# Patient Record
Sex: Male | Born: 1980 | Race: White | Hispanic: No
Health system: Western US, Academic
[De-identification: ages and names within clinical notes are randomized; demographics above are authoritative.]

## PROBLEM LIST (undated history)

## (undated) DIAGNOSIS — M1990 Unspecified osteoarthritis, unspecified site: Secondary | ICD-10-CM

## (undated) DIAGNOSIS — M259 Joint disorder, unspecified: Secondary | ICD-10-CM

## (undated) HISTORY — PX: PR APPENDECTOMY: 44950

## (undated) HISTORY — PX: PR UNLISTED PROCEDURE SHOULDER: 23929

## (undated) HISTORY — DX: Joint disorder, unspecified: M25.9

## (undated) HISTORY — PX: PR TONSILLECTOMY ONE-HALF <AGE 12: 42825

## (undated) HISTORY — DX: Unspecified osteoarthritis, unspecified site: M19.90

---

## 2015-11-08 ENCOUNTER — Telehealth (HOSPITAL_BASED_OUTPATIENT_CLINIC_OR_DEPARTMENT_OTHER): Payer: Self-pay | Admitting: Orthopaedic Surgery

## 2015-11-08 NOTE — Telephone Encounter (Signed)
Email received: Attached are photos I took of my X-rays from 03 Oct 2015.Chad Cordial, Nov 05, 2015 at 7:35 PM, Carolina Sink ?[email or web address]>? wrote:,\Good evening. I am messaging you with regard to my upcoming appointment on the 26th of June. 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 I have also included links that will allow you to download all the files for my X-rays and CT scan.I doubt you will be able to speed up when we are able to meet, but please let me know what you think, and if you have any preliminary ideas about what can be done. Thank you.\fs25Shoulder History: Summer of 2002 - Chainsaw accident (picture attached). Chainsaw kicked back during use and cut into the muscle of the front of the left shoulder. No damage to nerves or the shoulder itself. Cut the major blood vessel running through there (not sure what it's called). Sewn up in the ER. Winter of 2002/03 - Dislocated while snowboarding. Had arthroscopic surgery the following spring in [place] (forgot the doctor's name, may be in my medical record, or can locate later). Spring of 2007 - Dislocated playing intramural sports in college. Had arthroscopic surgery in [place] following graduation in May (forgot the doctor's name, may be in my medical record, or can locate later). 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 Had Latarjet surgery later that summer in [place] (forgot the doctor's name, may be in my medical record, or can locate later). Fall of 2015 - Blunt trauma to my shoulder caused one of the screws in the Latarjet procedure to shear. No dislocation. Had X-rays (picture attached) and CT scan which showed the damage to the screw. At the time the orthopedic doctor I saw said it was  fine, and that sometimes the screws shear on their own (not what my most recent ortho said I should have been told) 03 Oct 2015 - I wake up in the morning and eventually notice my shoulder/tricep are uncomfortable. I go through a range of motions and notice it feels stuck sometimes in positions elevated above my head. Rotations are also extremely painful. Afterward, I notice a significant amount of pressure in the front of my shoulder, with a dull, constant pain (3-4 on a 1-10 scale). Eventually any bicep activation (movement at the elbow) causes shooting pain into my shoulder (7 on a 1-10 scale). I go to the ER at [facility] in [place], [place] for X-rays (pictures attached in follow up email) and pain medication. 05 Oct 2015 - I have a referral for a CT scan at [facility] in [place], [place], with a follow up appointment to see Doctor Germain Osgood. 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 We will be deploying in about a year and a half, but will start our workups for deployment in about a year. My  short/medium term goal is to get healthy enough to fly again and be able to positively contribute to my squadron in a flying status. Long term I would like to still have a functional left arm, but would sacrifice some functionality for the ability to fly for the remainder of my career. If there is a way to accomplish the short, medium, and long term goals at the same time that would be great!\So, I have about a year to get what surgery/rehab completed while still training in simulators, then attempt to get back to flying.\Finally, here are the links to the cd files on google drive. Please download each group of files into its own folder. Then the auto-run functionality should work. If you have any issues please let me know.\X-Ray Files:\[email or web address]\CT Scan Files:{[email or web address]    Attached are photos I took of my X-rays from 03 Oct 2015.    On Fri, Nov 05, 2015 at 7:35 PM, Carolina Sinkhristopher Alvira [email or web address]> wrote:  Rudean HittSir,    Good evening. I am messaging you with regard to my upcoming appointment on the 26th of June. 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 I have outlined below the history of my left shoulder, where I currently am at in [employer] career, and my hopeful timeline for the future. I have also included links that will allow you to download all the files for my X-rays and CT scan. I doubt you will be able to speed up when we are able to meet, but please let me know what you think, and if you have any preliminary ideas about what can be done. Thank you.    Left Shoulder History:  - Summer of 2002 - Chainsaw accident (picture attached). Chainsaw kicked back during use and cut into the muscle of the front of the left shoulder. No damage to nerves or the shoulder itself. Cut the major blood vessel running through there  (not sure what it's called). Sewn up in the ER.  - Winter of 2002/03 - Dislocated while snowboarding. Had arthroscopic surgery the following spring in [place] (forgot the doctor's name, may be in my medical record, or can locate later).  - Spring of 2007 - Dislocated playing intramural sports in college. Had arthroscopic surgery in [place] following graduation in May (forgot the doctor's name, may be in my medical record, or can locate later).  - Summer of 2009 - Dislocated in flight school outside of work (sports). [place] (forgot the doctor's name, may be in my medical record, or can locate later).  - Fall of 2015 - Blunt trauma to my shoulder caused one of the screws in the Latarjet procedure to shear. No dislocation. Had X-rays (picture attached) and CT scan which showed the damage to the screw. At the time the orthopedic doctor I  saw said it was fine, and that sometimes the screws shear on their own (not what my most recent ortho said I should have been told)  - 03 Oct 2015 - I wake up in the morning and eventually notice my shoulder/tricep are uncomfortable. I go through a range of motions and notice it feels stuck sometimes in positions elevated above my head. Rotations are also extremely painful. Afterward, I notice a significant amount of pressure in the front of my shoulder, with a dull, constant pain (3-4 on a 1-10 scale). Eventually any bicep activation (movement at the elbow) causes shooting pain into my shoulder (7 on a 1-10 scale). I go to the ER at [facility] in [place], [place] for X-rays (pictures attached in follow up email) and pain medication.  - 05 Oct 2015 - I have a referral for a CT scan at [facility] in [place], [place], with a follow up appointment to see Doctor Germain Osgood. 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  - My shoulder is currently in minimal pain, as long as I do not bear weight on it, raise my arm over my head, or sleep in a weird position.    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 We will be deploying in about a year and a half, but will start our workups for deployment in about a year. My short/medium term goal is to get healthy enough to fly again and be able to positively contribute to my squadron in a flying status. Long term I would like to still have a functional left arm, but would sacrifice some functionality for the ability to fly for the remainder of my career. If there is a way to accomplish the short, medium, and long term goals at the same time that would be great!    So, I have about a year to get what surgery/rehab completed while still training in simulators, then attempt to get back to flying.    Finally, here are the links to the cd files on google drive. Please download each group of files into its own folder. Then the auto-run functionality should work. If you have any issues please let me know.    X-Ray Files:  [email or web address].[email or web address]/open?[email or web address]=[email or web address]    CT Scan Files:  [email or web address].[email or web address]/open?[email or web address]=[email or web address]      Very respectfully,  LT Chris Caris  [phone]

## 2015-11-22 ENCOUNTER — Encounter (HOSPITAL_BASED_OUTPATIENT_CLINIC_OR_DEPARTMENT_OTHER): Payer: Self-pay | Admitting: Orthopaedic Surgery

## 2015-11-22 ENCOUNTER — Ambulatory Visit: Attending: Orthopaedic Surgery | Admitting: Physician Assistant

## 2015-11-22 VITALS — BP 121/87 | HR 78 | Temp 98.7°F | Ht 69.0 in | Wt 170.0 lb

## 2015-11-22 DIAGNOSIS — T84228D Displacement of internal fixation device of other bones, subsequent encounter: Secondary | ICD-10-CM | POA: Insufficient documentation

## 2015-11-22 DIAGNOSIS — M25512 Pain in left shoulder: Secondary | ICD-10-CM | POA: Insufficient documentation

## 2015-11-22 DIAGNOSIS — Z6825 Body mass index (BMI) 25.0-25.9, adult: Secondary | ICD-10-CM

## 2015-11-22 NOTE — Progress Notes (Signed)
[facility] of Orthopaedics & Sports Medicine  Shoulder And Elbow Service       [facility]; [address] [place] ; [place], [place]  [address]  [phone]; [phone]    [email or web address]    Primary Care Provider:  Rexene Alberts, MD  [facility] - Aviation Medicine [address] [place], [place] [address]    Referring Provider:  Caron Presume De La Vina Ashley [facility] - Aviation Medicine  [address] [place] [address]    Patient Care Team:  No providers found        Subjective History  We had the pleasure of seeing [name] in our Shoulder and Elbow Clinic at the [facility] for a clinical visit.  He is a most delightful 35 year old male with left shoulder problems.    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  He reports that he was first injured in 2002 with a chainsaw kicked back and hit him in the shoulder.  He reports being sewn up and having "tight muscles" later.  Then in the winter of 2003, he dislocated his shoulder snowboarding and underwent shoulder surgery.  In 2007, he dislocated his shoulder again, this time playing field ball.  In the summer of 2007, he underwent arthroscopic surgery for his shoulder.     In 2009, [name] was in flight school and injured her shoulder playing paintball after landing on his shoulder.  In the summer of that year, he underwent a Latarjet surgery.     About a year and a half ago, [name] was [place] and his arm "chicken winged up".  And a month and a half ago, [name] was doing pull ups and chin ups.  This caused shoulder pain.  Two days later, [name] woke up and noted his shoulder felt like it was locking up.     X-rays and a CT scan demonstrated a loose body.    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  He is here to meet with  Dr. Barbette Or to discuss options.        Related Information   Chief Complaint   Patient presents with   . New Patient Consult     LEFT shoulder pain   Handed: right handed     Work Related Problem: No    Is a lawyer involved with this problem: No     History of Present Illness  1. Location - where is the problem located? Left Shoulder    2. Severity - Intensity of Pain/discomfort: (1 = No Pain, 10 = Severe Pain): 1     3. Context - How did this problem begin? Refer to subjective note above.     4. Modifying Factors -  What makes symptom(s) worse? Using affected side  Work  Exercise    What improves your symptom(s)? Rest  Ice  Heat  NSAIDs       Review of Systems  Constitutional: negative for weight gain, weight loss, fatigue, insomnia, fever and night-sweats/chills   Eyes: negative for glasses/contacts, cataracts and glaucoma    Ear/Nose/Throat: negative for sinus trouble, hearing loss and ringing in ears   Cardiovascular: negative for irregular heartbeat, high blood pressure, chest pain, fluttering  in chest and coronary disease   Respiratory: negative for shortness of breath, difficulty breathing, lung disease and persistent cough   Gastrointestinal: negative for decreased appetite, constipation, heartburn, nausea, diarrhea and hepatitis   Musculoskeletal:  arthritis   Genitourinary: negative for kidney stone, bladder/kidney infections, prostate problems and painful urinating   Skin/Integument:  negative for masses, blisters, non-healing wounds and dermatitis   Neurological:  negative for seizures, tingling, numbness and severe headaches   Psychiatric:  negative for anxiety, depression or other mental health issues   Endocrine:  negative for increased thirst, diabetes and thyroid disorders   Blood/Lymphatic: negative for bleeding or clotting problems, anemia and swollen or enlarged lymph nodes   Immunological: negative for HIV/AIDS, Sjgren's syndrome, scleroderma, hay fever and lupus   Cancer: negative for cancer         Other History  His health history includes:     Social History     Occupational History   . Not on file.     Social History Main Topics   . Smoking status: Former Games developermoker   . Smokeless tobacco: Not on file   . Alcohol use 1.2 - 1.8 oz/week     2 - 3 Cans of beer per week   . Drug use: No   . Sexual activity: Not on file   Marital Status: Single   Number of adults living with [name]: 1      Past Medical History:   Diagnosis Date   . Arthritis      Past Surgical History:   Procedure Laterality Date   . APPENDECTOMY     . REMOVAL OF TONSILS     . SHOULDER SURG PROC UNLISTED Left 2002, 2003, 2007, 2009    Multiple surgeries   Review of patient's allergies indicates:  Allergies   Allergen Reactions   . Ceclor [Cefaclor]    . Ciprofloxacin Hcl    . Sulfa Antibiotics      Current Outpatient Prescriptions   Medication Sig Dispense Refill   . Fexofenadine HCl (ALLEGRA ALLERGY OR)        No current facility-administered medications for this visit.      Family History   Problem Relation Age of Onset   . Cancer Paternal Grandmother         Physical Examination  Please note that a for the findings below:       "-" signifies a Negative finding       "+" signifies a Positive finding       a blank denotes test was not performed or documented    Constitutional:  General appearance:  [name] is a well developed, well nourished male in no apparent distress.   BP 121/87   Pulse 78   Temp 98.7 F (37.1 C) (Temporal)   Ht 5\' 9"  (1.753 m)   Wt 170 lb (77.1 kg)   SpO2 100%   BMI 25.10 kg/m     Psychological:  His judgment, insight, memory, mood and affect appear to be within normal limits    Neurological:  He is alert and oriented without any obvious gross neurological deficits    Upper Extremities:   Cardiovascular Inspection: Right Left   Edema Negative Negative        Respiratory: Right Left   Cyanosis Negative Negative        Hematologic: Right Left   Ecchymosis Negative Negative        Skin Inspection:  Right Left  Rashes  Negative Negative   Lesions Negative Negative   Ulcers Negative Negative   Erythema Negative Negative   Signs of infection Negative Negative        SHOULDER     Musculoskeletal Inspection-Visual:  Right Left   Obvious Musculoskeletal Deformity Negative Negative   Scapular Dyskinesis     AC Joint Asymmetry     SC Joint Asymmetry     "Popeye" Biceps deformity          Musculoskeletal Inspection-Palpation:  Right Left   Shoulder Crepitus      Rotator Cuff Defect Palpated     Pain With Palpation of AC Joint     Painful Palpation of Bicipital Groove     Other Painful Palpation     Biceps Saw/Upper Cut Test (biceps)     Yergason's (biceps)          Range of Motion of Shoulder: Right Left   Total Active Forward Elevation 160 120   Total Active External Rotation 40 0   Internal Rotation Back     External Rotation Abducted     Internal Rotation Abducted     Cross Body Adduction          Strength of Shoulder: Right Left   Supraspinatus  (Suprascapular nerve/C5, C6) 5 5   External Rotation  (Infraspinatus, Teres minor/Suprascapular nerve, Axillary nerve/C5, C6) 5 5   Internal Rotation/Belly Press  (subscapularis/ Upper and lower subscapular nerves/C5,C6) 5 5   Deltoid Abduction  (Deltoid/Axillary nerve/C5, C6)     Pseudoparalysis Negative Negative   External Rotation Lag     Product manager Sign     Shrug Sign          Stability Tests of Shoulder: Right Left   Obvious Gross Instability  Negative Negative   Anterior Superior Escape     Apprehension Test  Negative Slight apprehension    Jobe Relocation Test      Anterior Load and Shift Test Negative Crepitus    Posterior Load and Shift Test Negative Negative   Posterior Shoulder Instability Jerk Test     Kim's (labral)     Sulcus Sign     O'Brien's (labral)     Crank (labral)     SLAP (labral)     Dynamic External Rotation Shear (labral)     Speed's (biceps/labral)          Neurological: Right Left   Decreased Sensation Small Finger  (Ulnar Nerve/C8) Negative Negative    Decreased Sensation Distal Middle and Index Finger  (Median Nerve/C7) Negative Negative   Decreased Sensation Dorsal Proximal Thumb  (Radial Nerve/C6) Negative Negative        Other Tests: Right Left               Diagnostic Tests and Studies   X-ray Studies:  Left shoulder with anterior glenoid bone block.  Two screws are in the anterior glenoid.  One is broken and the other appears to be loose.     Other Imaging Studies:  CT scan of the shoulder demonstrates degenerative changes to the humeral head. There is an anterior bone block on the glenoid which does not appear to be united with the anterior glenoid. There appears to be a loose body in the glenohumeral joint space.     Laboratory:  None       Assessment  and Plan   In summary, [name] is a 35 year old male with left shoulder discomfort and dysfunction,  which appears to be caused by shoulder instability, failed hardware and degenerative joint disease.      [name] has a complex shoulder problem with a mix of previous shoulder instability, a failed Latarjet, shoulder degenerative joint disease and a bony loose body in the joint with causes a locking sensation.  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     One option for [name] is to proceed with a shoulder ream and run with removal or loose hardware and any loose bodies.  This would treat all of [name]'s problems but places him as risk of all the potential complications associated with a shoulder arthoplasty.  It is also not clear if [name] would be cleared to fly again if he had a shoulder arthroplasty.      Another option is to perform an open surgery where loose hardware and the loose body is removed.  This would likely improve [name]' shoulder function without all the potential problems associated with a shoulder arthroplasty.    Yet anther option is to proceed with an arthroscopic surgery where [name]'s loose body is removed.  This would be less  invasive than an open surgery, but may make it difficult or impossible to remove any loose hardware in the shoulder.    [name] is going to consider his thoughts and let us know how he would like to proceed.     Please note that [name] was seen by Dr. Karmen BongoFrederick A Matsen III who saw and examined this patient and agrees with this plan.           Wesley BlasAlexander L Bertelsen, PA-C  Orthopaedics and Sports Medicine  [facility]  Shoulder and Elbow Team

## 2017-02-07 ENCOUNTER — Ambulatory Visit: Attending: Orthopaedic Surgery | Admitting: Physician Assistant

## 2017-02-07 VITALS — BP 125/83 | HR 60 | Wt 185.0 lb

## 2017-02-07 DIAGNOSIS — Z96612 Presence of left artificial shoulder joint: Secondary | ICD-10-CM | POA: Insufficient documentation

## 2017-02-07 DIAGNOSIS — M19212 Secondary osteoarthritis, left shoulder: Secondary | ICD-10-CM | POA: Insufficient documentation

## 2017-02-07 NOTE — Progress Notes (Signed)
[place] [facility] [place] Department of Orthopaedics & Sports Medicine  Shoulder And Elbow Service       [facility]; [address] [place] ; [place], [place]  [address]  [phone]) [phone]; Fax:([phone]    [email or web address]    Primary Care Provider:  Outside PCP  Identifies Patients Who Have A Non [facility] Pcp    Referring Provider:  Unknown Pcp   A Patient Who Has A Pcp But Is Unsure Of The Name           Patient Care Team:  No providers found        Subjective History  We had the pleasure of seeing [name] in our Shoulder and Elbow Clinic at the [facility] for a return visit.  He is a most delightful 36 year old male who presents today for follow-up of chronic left shoulder problems.  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  He then had an anterior shoulder dislocation in 2003 while snowboarding and underwent an arthroscopic stabilization procedure.  He had a recurrent dislocation in 2007 and underwent a revision left arthroscopic shoulder stabilization procedure.  He once again dislocated his shoulder after a fall and underwent a Latarjet procedure in 2009.      He continued to have pain and mechanical symptoms in the shoulder and we evaluated him in June 2017 and offered him treatment options including arthroscopic loose body removal, open loose body and hardware removal or ream and run with hardware removal.    He subsequently underwent a left shoulder hardware removal and left shoulder hemiarthroplasty in July 2017 bu Dr. Selena Lesser in [place], [place].  He is here for follow-up and flight status clearance.  He is doing very well and has no specific complaints.  He denies pain at intake.  He has been flying since March and has no issues doing activities that he  need to do to effectively and safely fly his aircraft.  He has full confidence in his shoulder.  He denies fevers, chills or night sweats.       Related Information   Chief Complaint   Patient presents with    Follow-Up      Left Shoulder           History of Present Illness  1. Location - where is the problem located? Left Shoulder    2. Severity - Intensity of Pain/discomfort: (1 = No Pain, 10 = Severe Pain): 0     3. Context - How did this problem begin? Refer to subjective note above.      4. Modifying Factors -  What makes symptom(s) worse? No known modifying factors that make it worse    What improves your symptom(s)? Rest  Ice  Heat  Exercise  NSAIDs       Review of Systems  Fevers: No Chills: No Nausea: No Vomiting: No   Heart conditions No   Breathing problems No   Diabetes No        Other History  [id number]  Current Outpatient Prescriptions   Medication Sig Dispense Refill    Fexofenadine HCl (ALLEGRA ALLERGY OR)        No current facility-administered medications for this visit.  Physical Examination  Please note that a for the findings below:       "-" signifies a Negative finding       "+" signifies a Positive finding       a blank denotes test was not performed or documented    Constitutional:  General appearance:  [name] is a well developed, well nourished male in no apparent distress.     Psychological:  His judgment, insight, memory, mood and affect appear to be within normal limits    Neurological:  He is alert and oriented without any obvious gross neurological deficits    Respiratory:  He is without any obvious respiratory distress    ENT:  He is able to hear and understand verbal questions and commands    Upper Extremities:   Cardiovascular Inspection:  Left   Edema  Negative        Respiratory:  Left   Cyanosis  Negative        Hematologic:  Left   Ecchymosis  Negative        Skin Inspection:   Left   Rashes   Negative   Lesions  Negative   Ulcers  Negative   Erythema  Negative    Signs of infection  Negative        SHOULDER     Musculoskeletal Inspection-Visual:   Left   Obvious Musculoskeletal Deformity  Negative   Popeye Biceps deformity  Negative        Musculoskeletal Inspection-Palpation:   Left   Shoulder Crepitus   Negative   Rotator Cuff Defect Palpated  Negative   Pain With Palpation of AC Joint  Negative   Painful Palpation of Bicipital Groove  Negative   Other Painful Palpation  Negative   Biceps Saw/Upper Cut Test (biceps)  Negative        Range of Motion of Shoulder:  Left   Total Active Forward Elevation  160   Total Active External Rotation  60   Internal Rotation Back  T7   External Rotation Abducted  70   Internal Rotation Abducted  60   Cross Body Adduction  15        Strength of Shoulder:  Left   Supraspinatus  (Suprascapular nerve/C5, C6)  5   External Rotation  (Infraspinatus, Teres minor/Suprascapular nerve, Axillary nerve/C5, C6)  5   Internal Rotation/Belly Press  (subscapularis/ Upper and lower subscapular nerves/C5,C6)  5   Deltoid Abduction  (Deltoid/Axillary nerve/C5, C6)     Pseudoparalysis  Negative        Neurological:  Left   Decreased Sensation Small Finger  (Ulnar Nerve/C8)  Negative   Decreased Sensation Distal Middle and Index Finger  (Median Nerve/C7)  Negative   Decreased Sensation Dorsal Proximal Thumb  (Radial Nerve/C6)  Negative   Abnormal Biceps DTR  (C5, C6)          Other Tests:  Left               Diagnostic Tests and Studies   Imaging Studies  Radiographs of his left shoulder were reviewed and interpreted by the shoulder team.  There is evidence of left humeral hemiarthroplasty which appears to be in appropriate alignment and is without signs of hardware failure or complication.  There has been interval removal of hardware to the anterior glenoid with a retained portion of a screw in the glenoid.    Other Imaging Studies:  Other imaging studies were not obtained or available for  review today.    Laboratory:  None       Assessment  and Plan    In summary, [name] is a 36 year old male now over a year status post a left shoulder hemiarthroplasty, performed by Dr. Selena Lesser, who is doing well postoperatively.    He is doing very well now over one year status post his left shoulder hemiarthroplasty.  He has excellent range of motion and strength about his left shoulder and his radiographs demonstrate a well healed hemiarthroplasty without signs of complication.  We recommend that he be allowed to return to full, unrestricted flight status.  We provided him with a letter indicating this.    He should follow-up with [place] in 1 year or sooner as needed.    The patient was seen with and evaluated/examined by Dr. Barbette Or, who agrees with the above documentation and treatment plan.         Nada Libman, MD  Orthopaedics and Sports Medicine  [facility]  Shoulder and Elbow Team

## 2017-12-31 ENCOUNTER — Ambulatory Visit: Attending: Orthopaedic Surgery | Admitting: Orthopaedic Surgery

## 2017-12-31 ENCOUNTER — Ambulatory Visit (HOSPITAL_BASED_OUTPATIENT_CLINIC_OR_DEPARTMENT_OTHER)

## 2017-12-31 VITALS — BP 121/82 | HR 66 | Temp 99.2°F

## 2017-12-31 DIAGNOSIS — Z471 Aftercare following joint replacement surgery: Secondary | ICD-10-CM

## 2017-12-31 DIAGNOSIS — Z96612 Presence of left artificial shoulder joint: Secondary | ICD-10-CM

## 2018-01-03 NOTE — Progress Notes (Signed)
[place] [facility] [place] Department of [facility]opaedics & Sports Medicine  Shoulder And Elbow Service       [facility]; [address] [place] ; [place], [place]  [id number]  [phone]; Fax:(206) [phone]    [email or web address]    Primary Care Provider:  Outside PCP  Identifies Patients Who Have A Non [facility] Pcp    Referring Provider:  No ref. provider found              Patient Care Team:  No providers found        Subjective History  We had the pleasure of seeing [name] in our Should[facility] at [facility] for a return visit.  He is a most delightful 37 year old male here for follow up after his left shoulder arthroplasty.    Mr. Lyndel Safeigus was first seen by us in 2017.  History from his initial visit with us include:  Mr. Lyndel Safeigus is a [employer] with left shoulder dysfunction.  He reports that he was first injured in 2002 with a chainsaw kicked back and hit him in the shoulder.  He reports being sewn up and having "tight muscles" later.  Then in the winter of 2003, he dislocated his shoulder snowboarding and underwent shoulder surgery.  In 2007, he dislocated his shoulder again, this time playing field ball.  In the summer of 2007, he underwent arthroscopic surgery for his shoulder.     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  In the summer of that year, he underwent a Latarjet surgery.     About a year and a half ago, Mr. Lyndel Safeigus was jumping [place] and his arm "chicken winged up".  And a month and a half ago, Mr. Lyndel Safeigus was doing pull ups and chin ups.  This caused shoulder pain.  Two days later, Mr. Lyndel Safeigus woke up and noted his shoulder felt like it was locking up.     X-rays and a CT scan demonstrated a loose body.    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  He is here to meet  with Dr. Barbette OrMatsen to discuss options.     Mr. Lyndel Safeigus is doing well and has no complaints and continues to fly without limitations.  He is here for an annual follow up visit with Dr. Barbette OrMatsen.          Related Information   Chief Complaint   Patient presents with    Follow-Up      status post a left shoulder hemiarthroplasty          History of Present Illness  1. Location - where is the problem located? Left Shoulder    2. Severity - Intensity of Pain/discomfort: (1 = No Pain, 10 = Severe Pain): 0     3. Context - How did this problem begin? Refer to subjective note above.      4. Modifying Factors -  What makes symptom(s) worse? No known modifying factors that make it worse    What improves your symptom(s)? No known modifying factors that make it better       Review of Systems  Fevers: No Chills: No Nausea: No Vomiting: No   Heart conditions No   Breathing problems No  Diabetes No        Other History  Current Outpatient Medications   Medication Sig Dispense Refill    Fexofenadine HCl (ALLEGRA ALLERGY OR)       loratadine (CLARITIN) 10 MG Oral Tab Take 10 mg by mouth daily.       No current facility-administered medications for this visit.         Physical Examination  Please note that a for the findings below:       "-" signifies a Negative finding       "+" signifies a Positive finding       a blank denotes test was not performed or documented    Constitutional:  General appearance:  Mr. Reffner is a well developed, well nourished male in no apparent distress.     Psychological:  His judgment, insight, memory, mood and affect appear to be within normal limits    Neurological:  He is alert and oriented without any obvious gross neurological deficits    Upper Extremities:   Cardiovascular Inspection:  Left   Edema  Negative        Respiratory:  Left   Cyanosis  Negative        Hematologic:  Left   Ecchymosis  Negative        Skin Inspection:  Left   Rashes   Negative   Lesions  Negative   Ulcers  Negative   Erythema   Negative   Signs of infection  Negative        Musculoskeletal:  Left   Active forward elevation of both shoulders greater than 140 degrees.        Diagnostic Tests and Studies   Imaging Studies  X-rays of the shoulder do not demonstrate any grossly obvious fractures or dislocations.    There is shoulder hemiarthroplasty in good anatomical alignment.     There is still a broken screw in the glenoid     Other Imaging Studies:  Other imaging studies were not obtained or available for review today.    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    Mr. Macomber has full functional range of motion of the left shoulder and has no limitations to flying as a pilot.     Xrays shows what appears to be a well fixed shoulder arthroplasty without signs of loosening.    Our assessment is a normal post operative course without any signs of concern. Mr. Krasinski should be able to proceed without limitations.     Please note that Mr. Birman was seen by Dr. Karmen Bongo III who saw and examined this patient and agrees with this plan.           Wesley Blas, PA-C  Orthopaedics and Sports Medicine  [facility]  Shoulder and Elbow Team

## 2018-12-06 ENCOUNTER — Ambulatory Visit: Attending: Physician Assistant | Admitting: Physician Assistant

## 2018-12-06 ENCOUNTER — Encounter (HOSPITAL_BASED_OUTPATIENT_CLINIC_OR_DEPARTMENT_OTHER): Admitting: Physician Assistant

## 2018-12-06 ENCOUNTER — Encounter (HOSPITAL_BASED_OUTPATIENT_CLINIC_OR_DEPARTMENT_OTHER): Payer: Self-pay | Admitting: Physician Assistant

## 2018-12-06 ENCOUNTER — Ambulatory Visit (HOSPITAL_BASED_OUTPATIENT_CLINIC_OR_DEPARTMENT_OTHER)

## 2018-12-06 VITALS — BP 122/75 | HR 60 | Temp 98.9°F

## 2018-12-06 DIAGNOSIS — M25512 Pain in left shoulder: Secondary | ICD-10-CM | POA: Insufficient documentation

## 2018-12-06 DIAGNOSIS — Z96612 Presence of left artificial shoulder joint: Secondary | ICD-10-CM | POA: Insufficient documentation

## 2018-12-06 DIAGNOSIS — Z471 Aftercare following joint replacement surgery: Secondary | ICD-10-CM

## 2018-12-06 NOTE — Progress Notes (Signed)
[facility] [facility] [place] Department of Orthopaedics & Sports Medicine  Shoulder And Elbow Service       [facility]; [address] [place] ; [place], [place]  [id number]  [phone]; Fax:([phone]    [email or web address]    Primary Care Provider:  Outside PCP  Identifies Patients Who Have A Non [facility] Pcp    Referring Provider:  No ref. provider found              Patient Care Team:  No providers found        Subjective History  We had the pleasure of seeing Mr. Demetria Porehristopher Allen Chavous in our Shoulder and Elbow Clinic at [facility] for a return visit.  He is a most delightful 38 year old male with a left shoulder arthroplasty    Mr. Lyndel Safeigus was first seen by us in 2017.  History from his initial visit with us include:  Mr. Lyndel Safeigus is a [employer] with left shoulder dysfunction.  He reports that he was first injured in 2002 with a chainsaw kicked back and hit him in the shoulder.  He reports being sewn up and having "tight muscles" later.  Then in the winter of 2003, he dislocated his shoulder snowboarding and underwent shoulder surgery.  In 2007, he dislocated his shoulder again, this time playing field ball.  In the summer of 2007, he underwent arthroscopic surgery for his shoulder.     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  In the summer of that year, he underwent a Latarjet surgery.     About a year and a half ago, Mr. Lyndel Safeigus was jumping in a lake and his arm "chicken winged up".  And a month and a half ago, Mr. Lyndel Safeigus was doing pull ups and chin ups.  This caused shoulder pain.  Two days later, Mr. Lyndel Safeigus woke up and noted his shoulder felt like it was locking up.     X-rays and a CT scan demonstrated a loose body.    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  He is here to meet with Dr. Barbette OrMatsen to  discuss options.     We saw Mr. Lyndel Safeigus and he was doing well.  Mr. Lyndel Safeigus reports that with recent deployments and isolation, he has been working out and continues to improve his shoulder function.        Related Information   Chief Complaint   Patient presents with   . Musculoskeletal Problem     yearly F/ u left shoulder             Other History  Current Outpatient Medications   Medication Sig Dispense Refill   . Fexofenadine HCl (ALLEGRA ALLERGY OR)      . loratadine (CLARITIN) 10 MG Oral Tab Take 10 mg by mouth daily.       No current facility-administered medications for this visit.         Physical Examination  Please note that a for the findings below:       "-" signifies a Negative finding       "+" signifies a Positive finding       a blank denotes test was not performed or documented    Constitutional:  General appearance:  Mr. Lyndel Safeigus is  a well developed, well nourished male in no apparent distress.     Psychological:  His judgment, insight, memory, mood and affect appear to be within normal limits    Neurological:  He is alert and oriented without any obvious gross neurological deficits    Upper Extremities:   Cardiovascular Inspection:  Left   Edema  Negative        Respiratory:  Left   Cyanosis  Negative        Hematologic:  Left   Ecchymosis  Negative        Skin Inspection:  Left   Rashes   Negative   Lesions  Negative   Ulcers  Negative   Erythema  Negative   Signs of infection  Negative        Musculoskeletal:  Left   Left shoulder easily forward elevates to 150 degrees without any obvious discomfort.    Range of motion in other directions look great       Diagnostic Tests and Studies   Imaging Studies  X-rays of the shoulder do not demonstrate any grossly obvious fractures or dislocations.    There is shoulder hemiarthroplasty in good anatomical alignment.     There is a screw remnant in the center of the glenoid in similar alignment as in previous images    His coracoid appears to be free floating  and in similar position as with previous xrays.     Other Imaging Studies:  Other imaging studies were not obtained or available for review today.    Laboratory:  None       Assessment  and Plan   In summary, Mr. Fudala is a 38 year old male with a history of left shoulder reconstruction with arthroplasty and previous anterior glenoid construction with autograft.    Mr. Birdwell seems to be doing great.  He has excellent active range of motion of the shoulder.  Xrays show what appears to be a well fixed shoulder hemiarthroplasty in good alignment.    We do not anticipate any limitations in Mr. Nabers shoulder function.     Please note that today's visit lasted more than 15 minutes, of which more than half was spent in face-to-face counseling regarding conservative management.             Leim Fabry, PA-C  [facility] and [place]  Shoulder and Elbow Team

## 2019-11-18 IMAGING — MR MRI ELBOW LT WO CONTRAST
5 series · 40 of 40 positions shown · non-contrast
Comparison: None available.

INDICATION: Pain/weakness left elbow
TECHNIQUE: Multiplanar multisequence imaging of the left elbow was performed without contrast.

[Series 4: t1_axial · axial · left · 3.0mm · 0.34mm/px · z∈[-78,+15]mm · 10 of 30 slices shown]
[im 1/30]
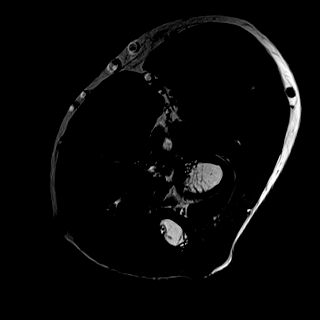
[im 4/30]
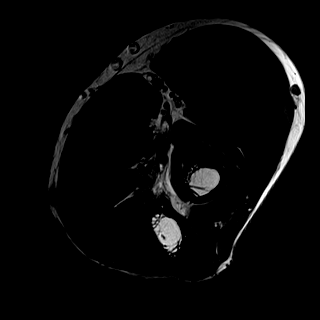
[im 7/30]
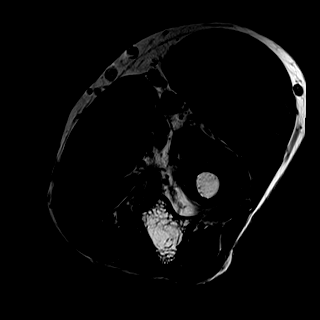
[im 10/30]
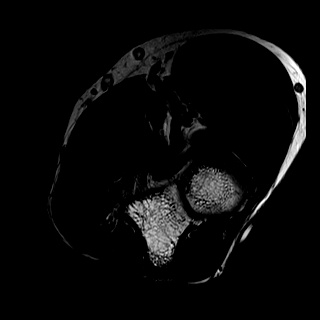
[im 13/30]
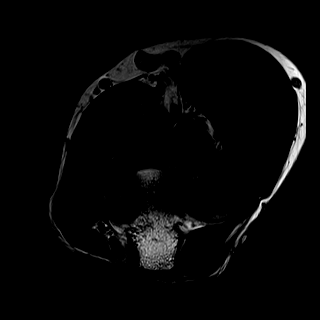
[im 17/30]
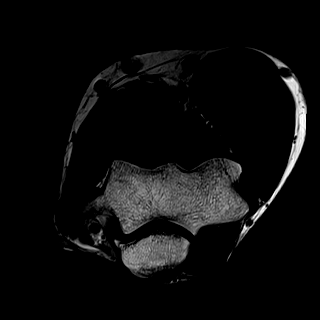
[im 20/30]
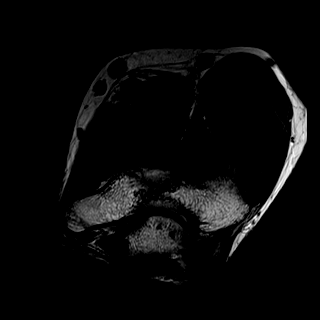
[im 23/30]
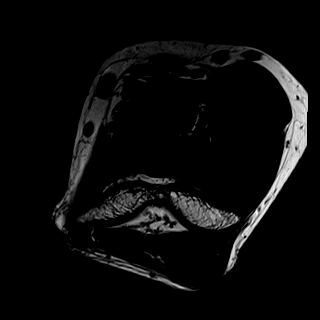
[im 26/30]
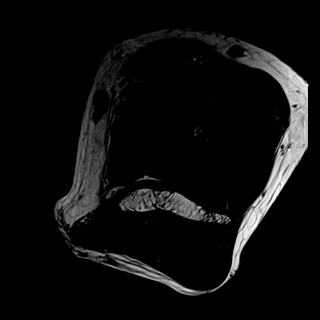
[im 30/30]
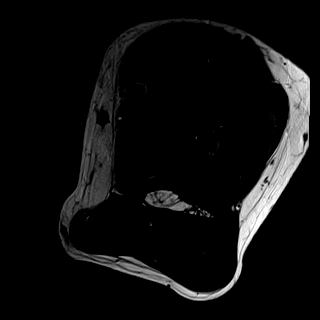

[Series 5: t2_axial_fs · axial · left · 3.0mm · 0.43mm/px · z∈[-78,+15]mm · 10 of 30 slices shown]
[im 1/30]
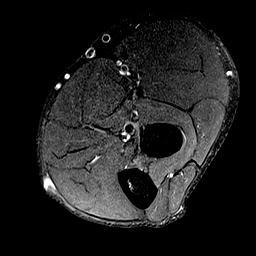
[im 4/30]
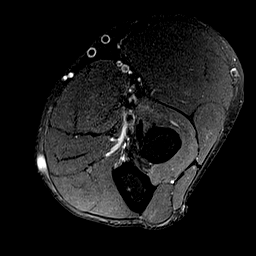
[im 7/30]
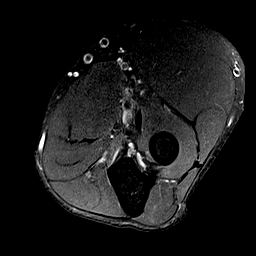
[im 10/30]
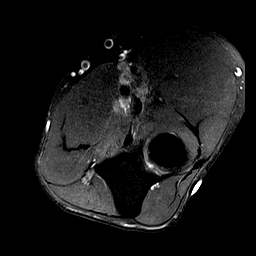
[im 13/30]
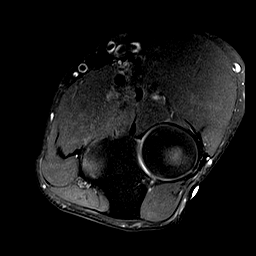
[im 17/30]
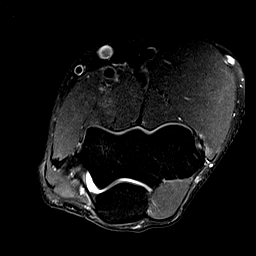
[im 20/30]
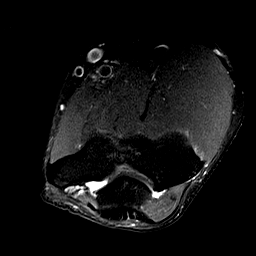
[im 23/30]
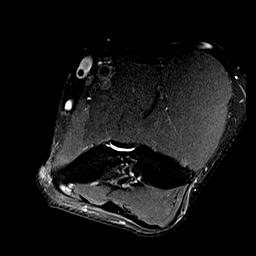
[im 26/30]
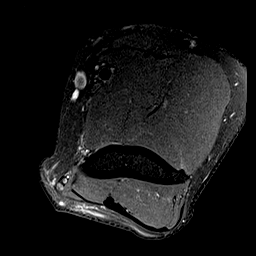
[im 30/30]
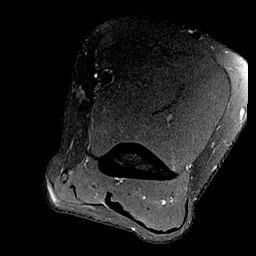

[Series 6: t1_cor · coronal · left · 3.0mm · 0.38mm/px · 6 of 20 slices shown]
[im 1/20]
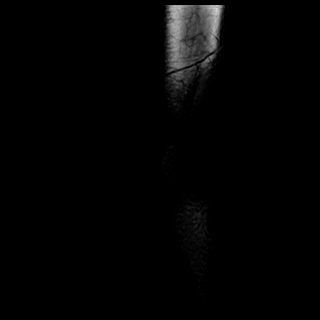
[im 4/20]
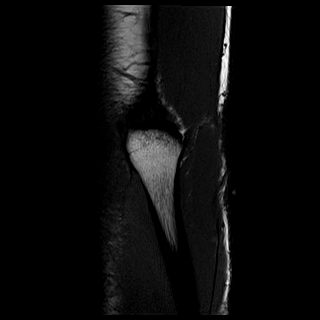
[im 8/20]
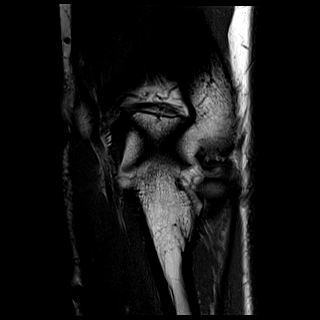
[im 12/20]
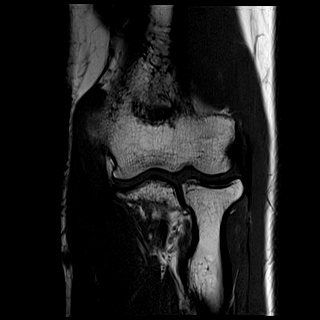
[im 16/20]
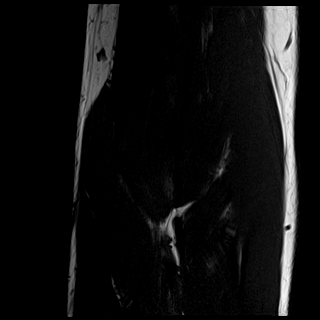
[im 20/20]
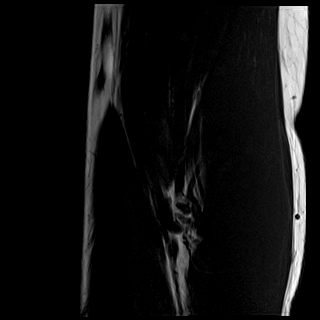

[Series 7: t2_cor_fs · coronal · left · 3.0mm · 0.23mm/px · 6 of 20 slices shown]
[im 1/20]
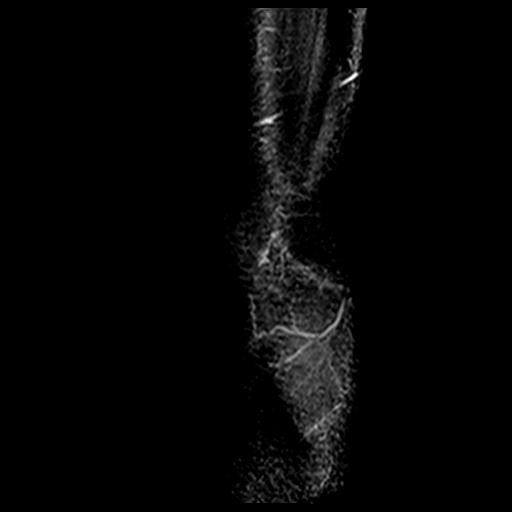
[im 4/20]
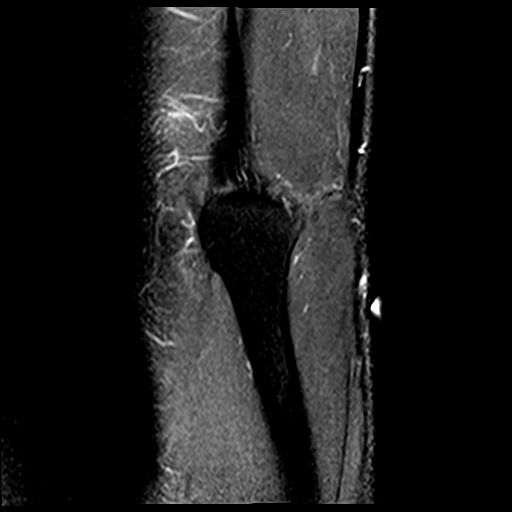
[im 8/20]
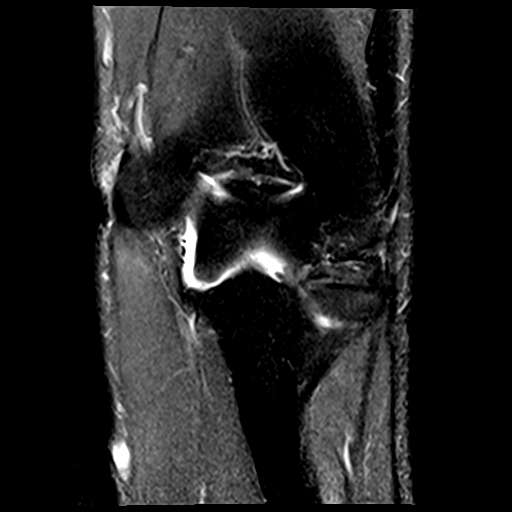
[im 12/20]
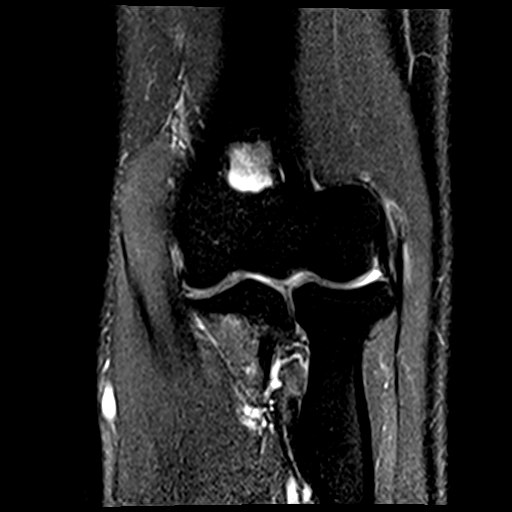
[im 16/20]
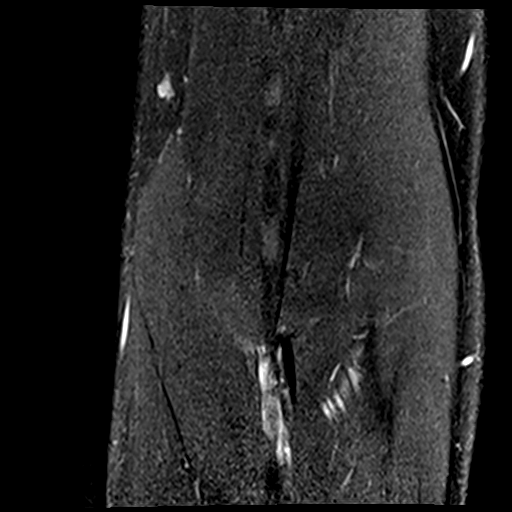
[im 20/20]
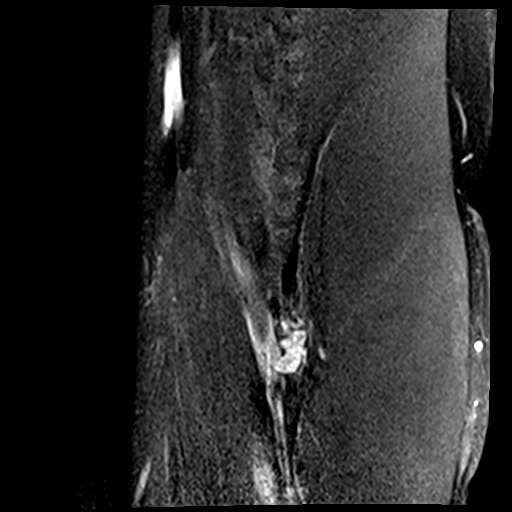

[Series 8: t2_sag_fs · sagittal · left · 3.0mm · 0.47mm/px · 8 of 24 slices shown]
[im 1/24]
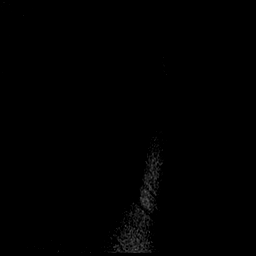
[im 4/24]
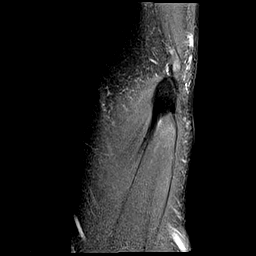
[im 7/24]
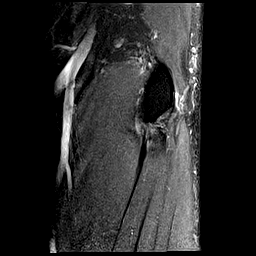
[im 10/24]
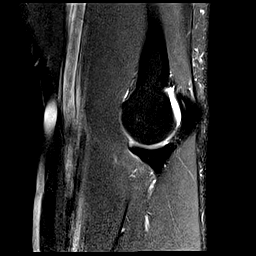
[im 14/24]
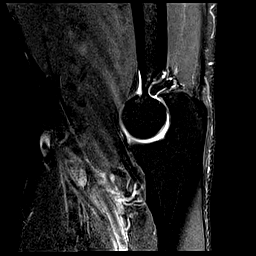
[im 17/24]
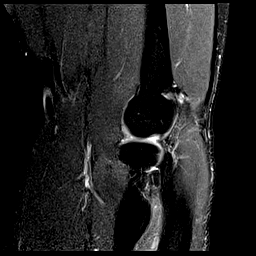
[im 20/24]
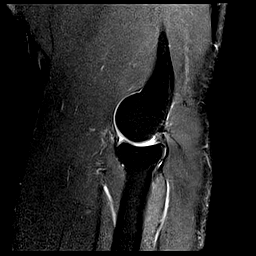
[im 24/24]
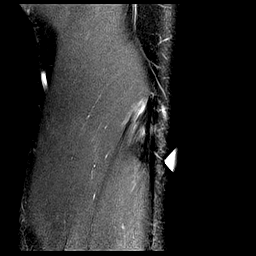

[40 of 40 positions shown; findings below may reference images not displayed]

FINDINGS: Osseous: No acute fracture, avascular necrosis or aggressive osseous lesion.

Ligaments: The ulnar collateral ligament is intact. The radial collateral ligament, lateral ulnar collateral ligament and annular ligaments are intact.

Tendons: Mild common extensor origin tendinosis, with a small partial-thickness tear of the undersurface of the common extensor tendon origin measuring 2 x 3 mm. Mild peritendinous edema. The common flexor tendon origin is intact. The distal biceps and brachialis tendons are intact. No intramuscular edema or focal fatty atrophy.

Elbow joint: No joint effusion. No intra-articular bodies. The articular cartilage is intact.

Other: The ulnar nerve is normal in size and signal intensity.
IMPRESSION: Mild common extensor tendinosis, with small partial-thickness undersurface tear at the tendon origin.

## 2019-11-18 IMAGING — MR MRI SHOULDER RT WO CONTRAST
4 series · 40 of 40 positions shown · non-contrast
Comparison: None available.

INDICATION: Chronic right shoulder pain not responding to conservative therapy
TECHNIQUE: Multiplanar, multisequence MR imaging of the right shoulder without contrast.

[Series 5: t2_axial_fs · axial · right · 3.0mm · 0.44mm/px · z∈[-20,+68]mm · 10 of 24 slices shown]
[im 1/24]
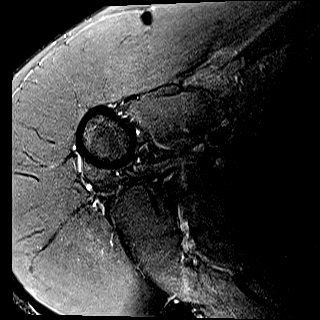
[im 3/24]
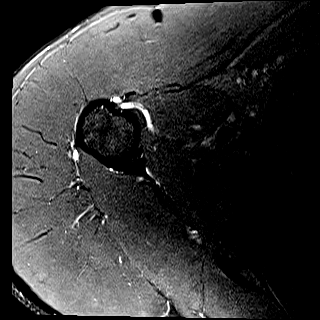
[im 6/24]
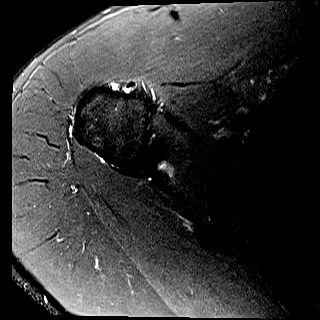
[im 8/24]
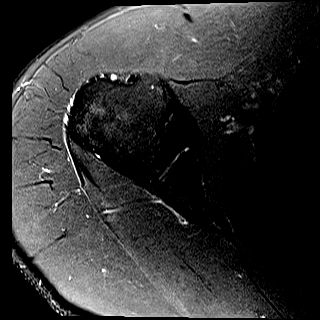
[im 11/24]
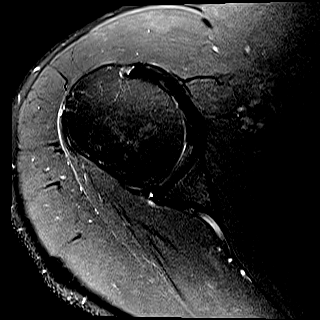
[im 13/24]
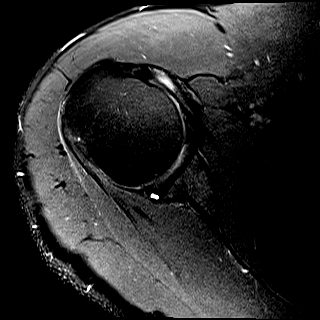
[im 16/24]
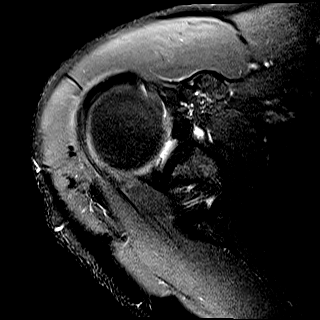
[im 18/24]
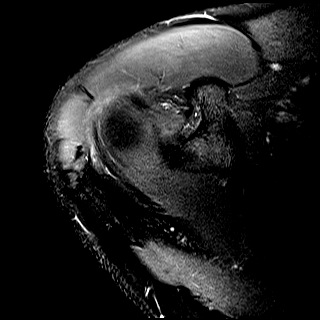
[im 21/24]
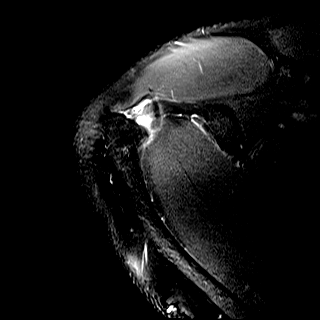
[im 24/24]
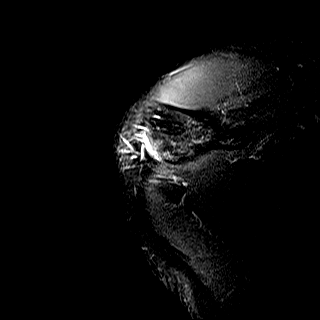

[Series 6: t2_cor_obl_fs · oblique · right · 3.0mm · 0.44mm/px · 8 of 19 slices shown]
[im 1/19]
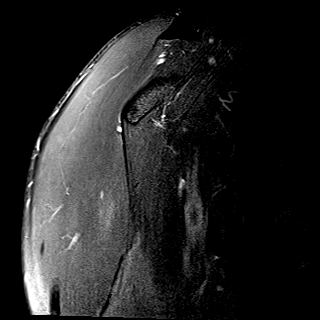
[im 3/19]
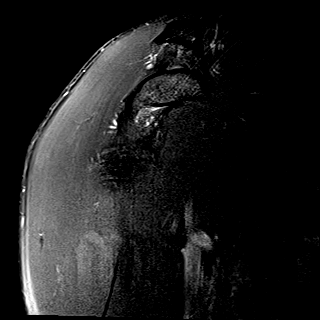
[im 6/19]
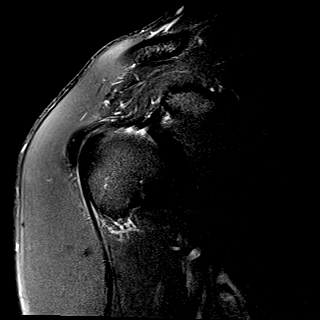
[im 8/19]
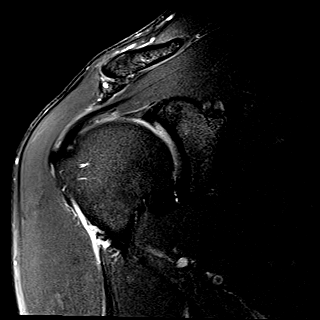
[im 11/19]
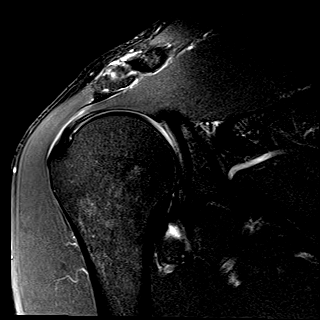
[im 13/19]
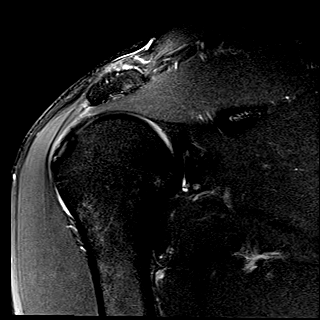
[im 16/19]
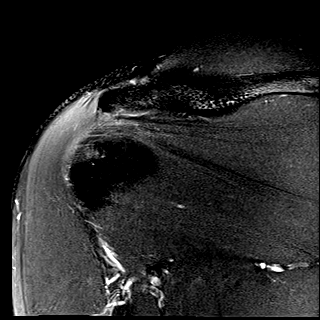
[im 19/19]
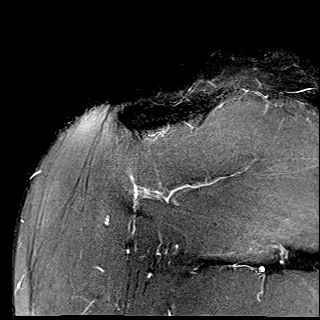

[Series 7: t2_sag_obl_fs · oblique · right · 3.0mm · 0.44mm/px · 11 of 28 slices shown]
[im 1/28]
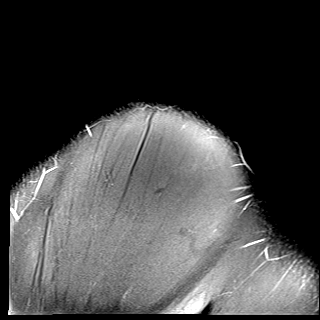
[im 3/28]
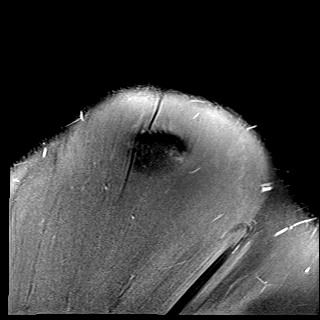
[im 6/28]
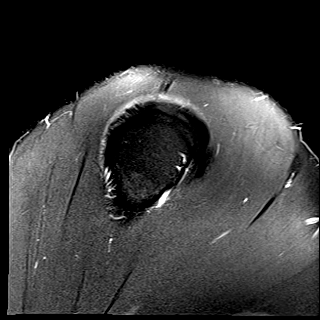
[im 9/28]
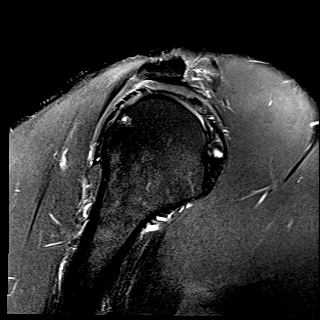
[im 11/28]
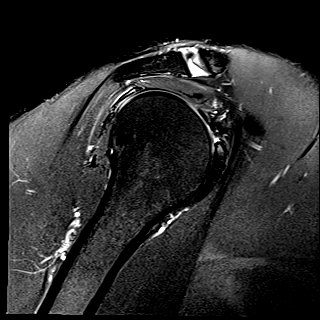
[im 14/28]
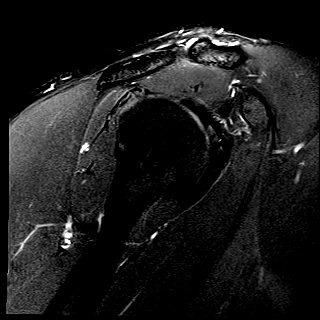
[im 17/28]
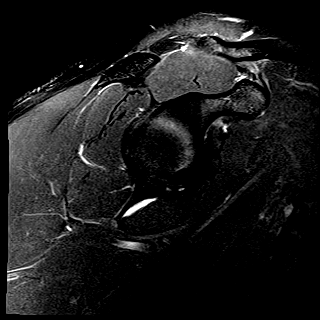
[im 19/28]
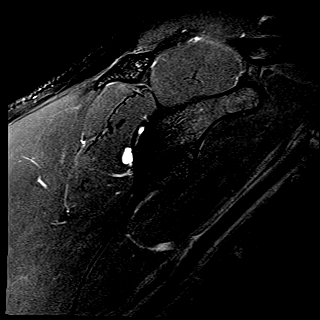
[im 22/28]
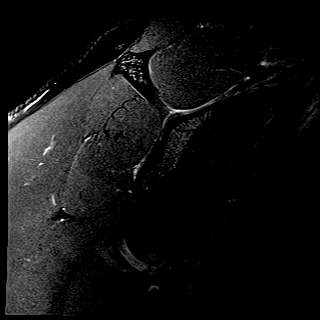
[im 25/28]
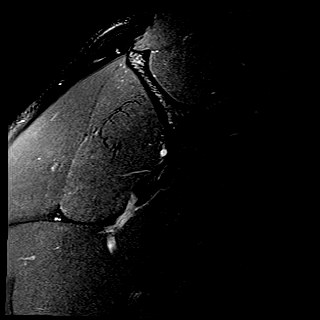
[im 28/28]
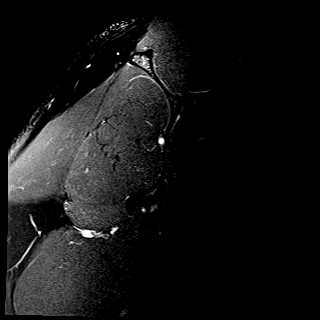

[Series 8: t1_sag_obl · oblique · right · 3.0mm · 0.36mm/px · 11 of 28 slices shown]
[im 1/28]
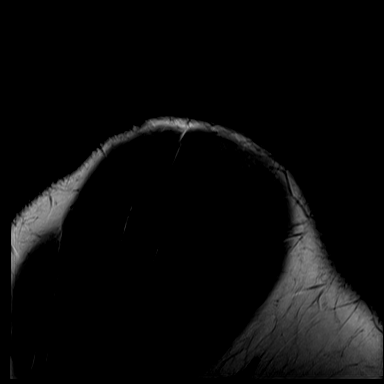
[im 3/28]
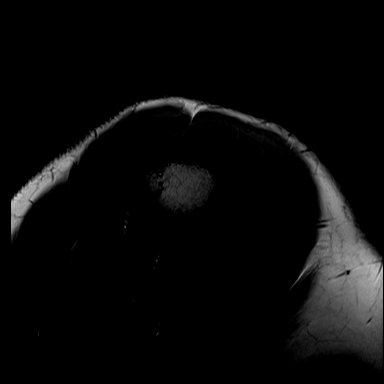
[im 6/28]
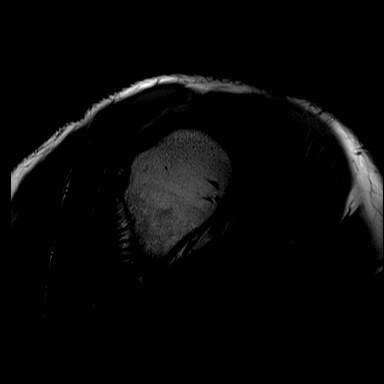
[im 9/28]
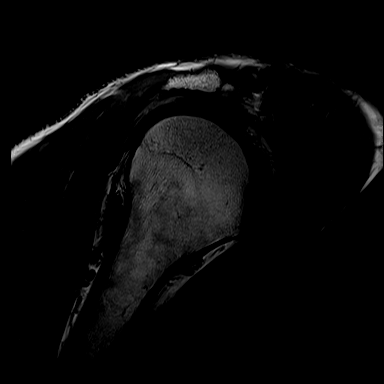
[im 11/28]
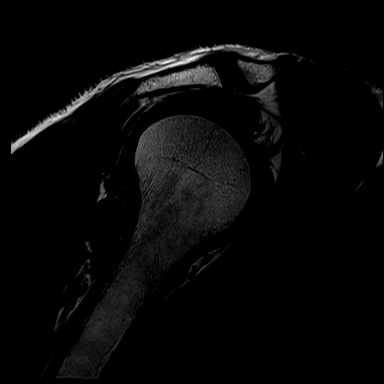
[im 14/28]
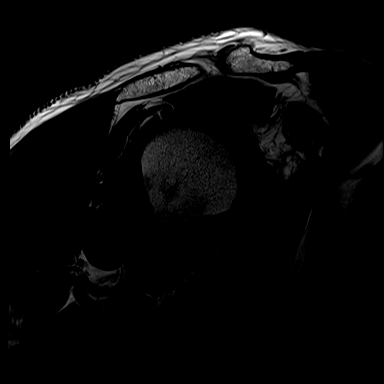
[im 17/28]
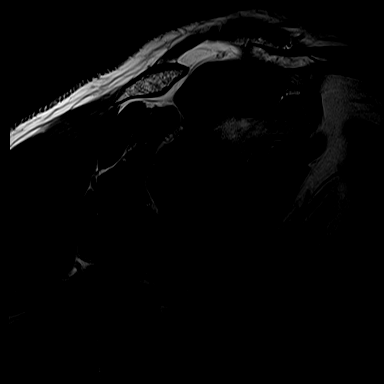
[im 19/28]
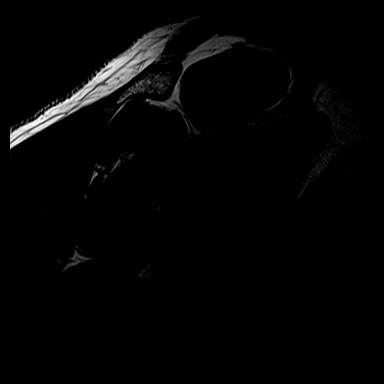
[im 22/28]
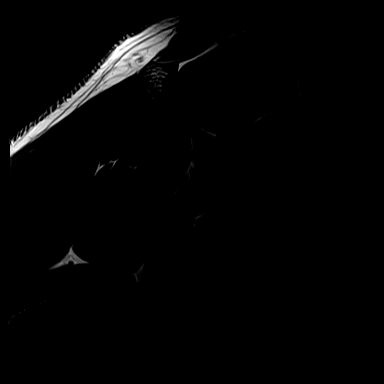
[im 25/28]
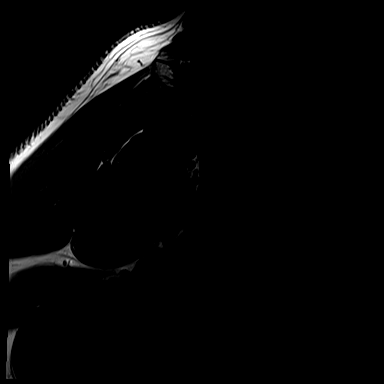
[im 28/28]
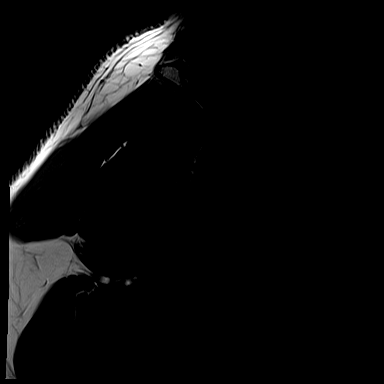

[40 of 40 positions shown; findings below may reference images not displayed]

FINDINGS: OSSEOUS: No acute fracture, avascular necrosis or aggressive osseous lesion. 

ACROMIAL OUTLET: Minimal acromioclavicular joint osteoarthritis. Trace fluid is present within the subacromial/subdeltoid bursa. The acromion is nonhooked. The coracoclavicular and coracoacromial ligaments are intact.

ROTATOR CUFF:

Supraspinatus and infraspinatus: Trace tendinosis of the anterior supraspinatus tendon. The infraspinatus tendon is intact.

Teres minor: Intact.

Subscapularis: Mild superior subscapularis tendinosis.

Fatty atrophy: The rotator cuff muscles are maintained.

LABRUM: A tear of the posterior and posterior inferior labrum is present, extending to the chondral labral junction. Associated multiloculated paravertebral cyst measuring 6 x 9 mm.

BICEPS TENDON: The proximal intra-articular and extra-articular long head biceps tendon are intact.

GLENOHUMERAL JOINT: The glenohumeral articular cartilage is maintained. No focal chondral defect is identified. The glenohumeral joint is normal in alignment.

OTHER: The inferior glenohumeral ligament is unremarkable. No glenohumeral joint effusion.
IMPRESSION: 1.
Tear of the posterior and posterior inferior labrum, with associated paralabral cyst.

2.
Trace supraspinatus and mild subscapularis tendinosis. No rotator cuff tear.

## 2021-10-13 IMAGING — MR MRI SHOULDER RT WO CONTRAST
4 of 5 series · 33 of 40 positions shown · non-contrast
Comparison: MRI right shoulder study 11/18/2019.

HISTORY: 40-year-old male with right shoulder pain.
TECHNIQUE: MRI study of the right shoulder was performed using coronal oblique, sagittal oblique and axial images of varying sequences without contrast.

[Series 3: t2_axial_fs · axial · 4.0mm · 0.66mm/px · z∈[-34,+54]mm · 7 of 21 slices shown]
[im 1/21]
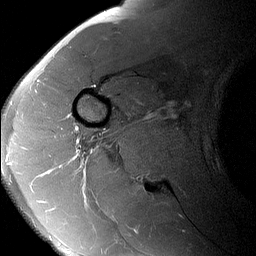
[im 4/21]
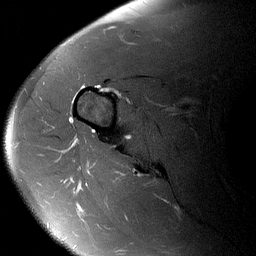
[im 7/21]
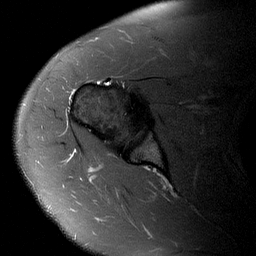
[im 11/21]
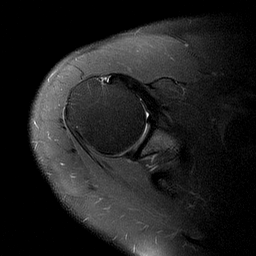
[im 14/21]
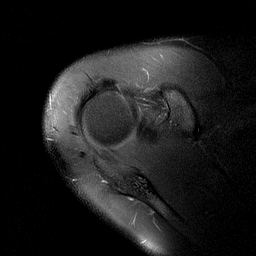
[im 17/21]
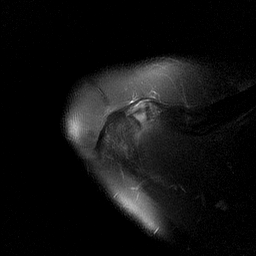
[im 21/21]
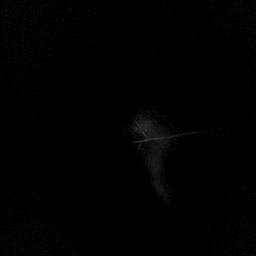

[Series 4: t2_cor_obl_fs · oblique · 4.0mm · 0.29mm/px · 8 of 22 slices shown]
[im 1/22]
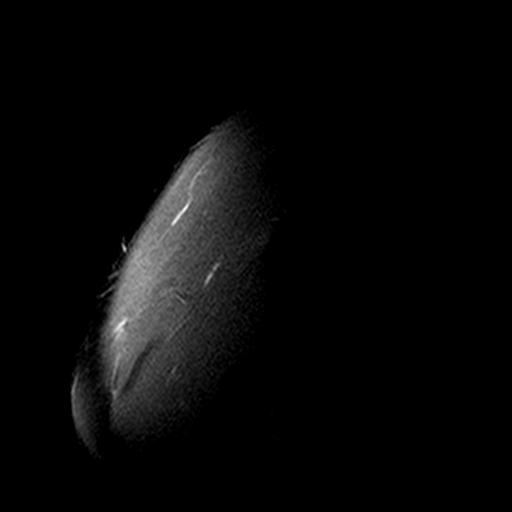
[im 4/22]
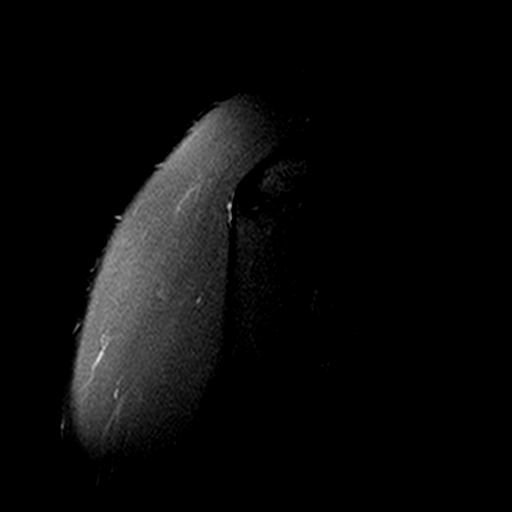
[im 7/22]
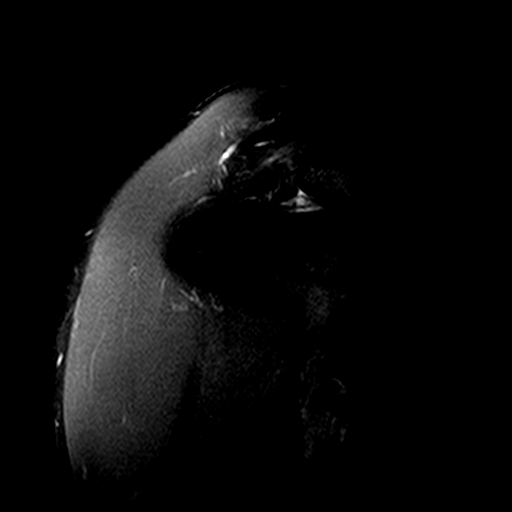
[im 10/22]
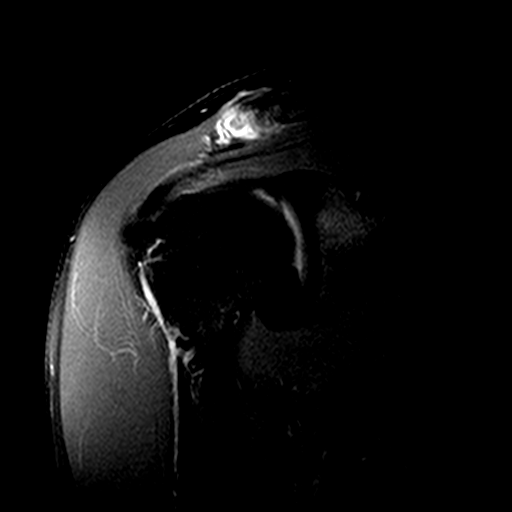
[im 13/22]
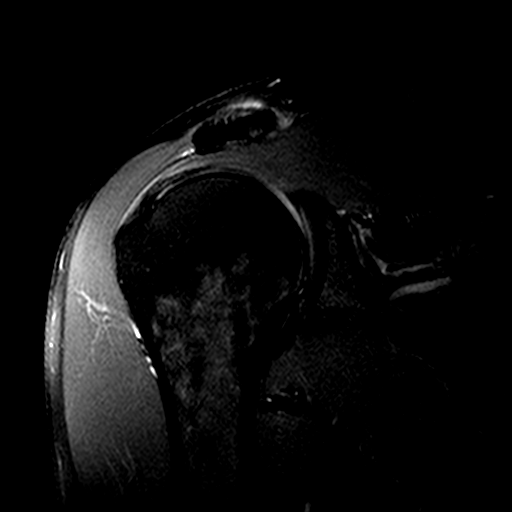
[im 16/22]
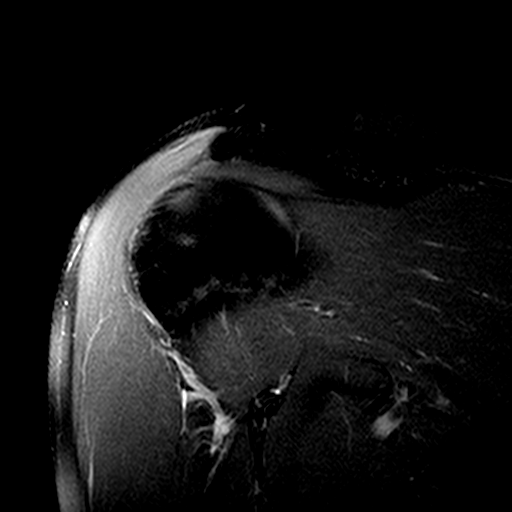
[im 19/22]
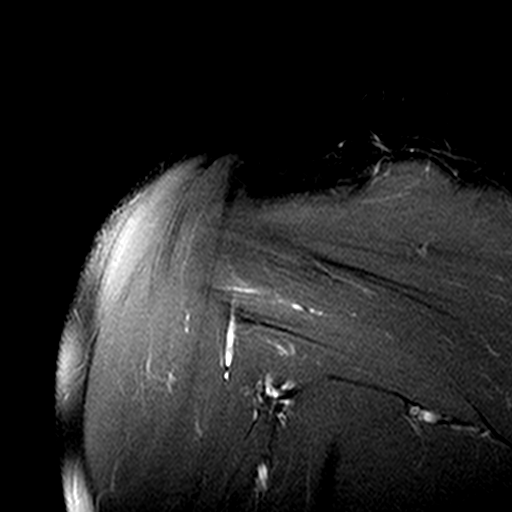
[im 22/22]
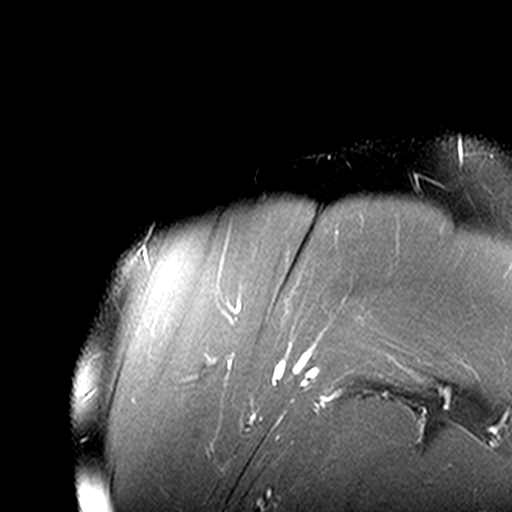

[Series 5: t2_sag_fs · oblique · 4.0mm · 0.47mm/px · 9 of 27 slices shown]
[im 1/27]
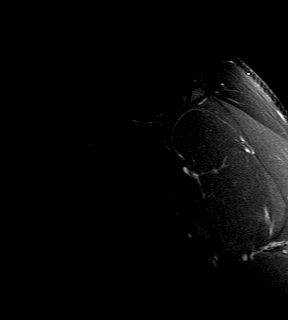
[im 4/27]
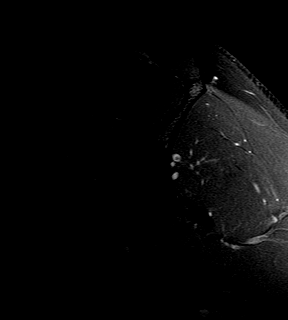
[im 7/27]
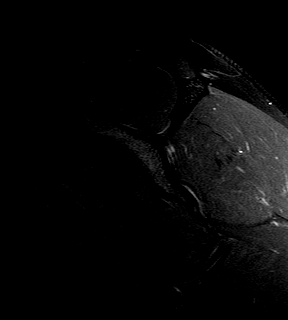
[im 10/27]
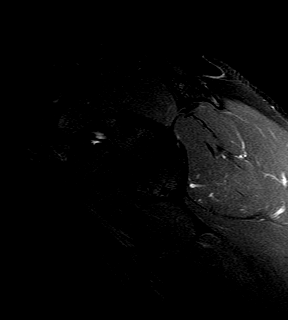
[im 14/27]
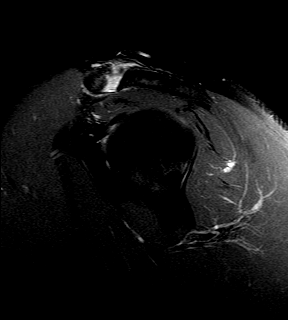
[im 17/27]
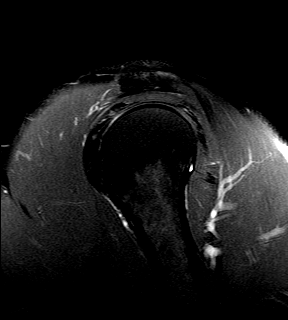
[im 20/27]
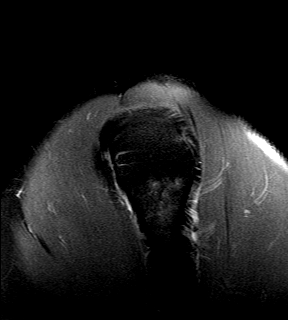
[im 23/27]
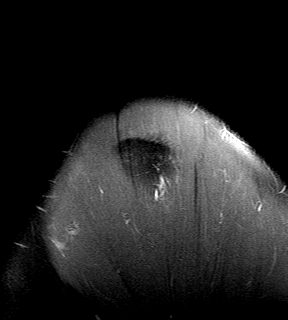
[im 27/27]
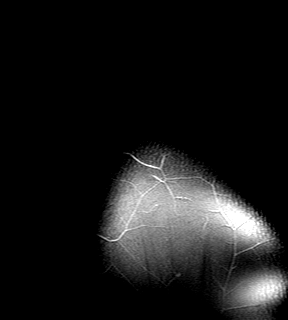

[Series 6: t1_sag · oblique · 4.0mm · 0.47mm/px · 9 of 27 slices shown]
[im 1/27]
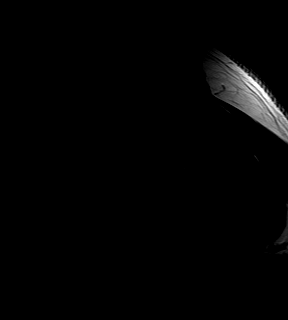
[im 4/27]
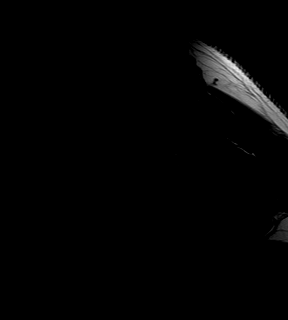
[im 7/27]
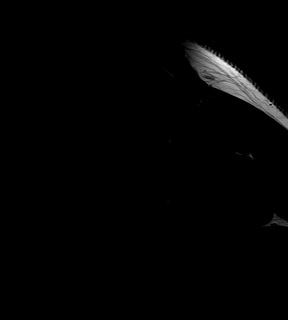
[im 10/27]
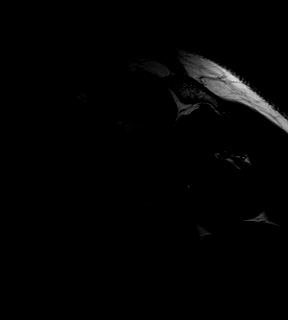
[im 14/27]
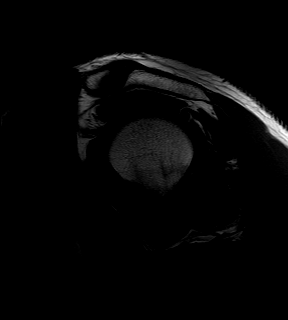
[im 17/27]
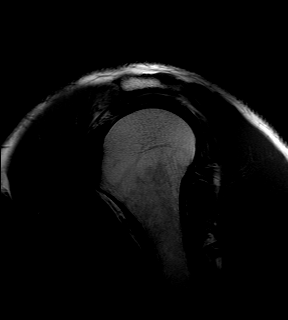
[im 20/27]
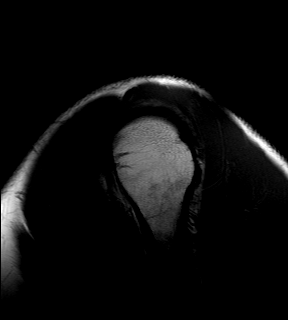
[im 23/27]
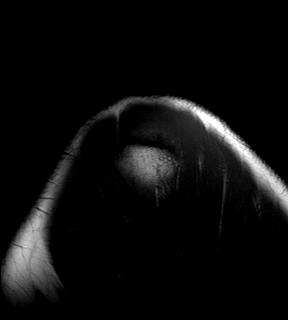
[im 27/27]
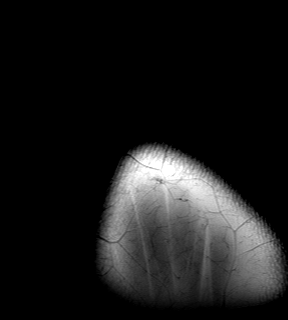

[33 of 40 positions shown; findings below may reference images not displayed]

FINDINGS: OSSEOUS: No acute fracture, avascular necrosis or aggressive osseous lesion. 

ACROMIAL OUTLET: 

Acromioclavicular joint osteoarthrosis: Mild.

Acromion: Non-hooked.

Subacromial/subdeltoid bursa fluid: No significant fluid.

Coracoclavicular and coracoacromial ligaments: Intact.

ROTATOR CUFF:

Supraspinatus and infraspinatus: Intact.

Teres minor: Intact.

Subscapularis: Intact.

Rotator cuff muscles: No fatty atrophy or intramuscular edema.

LABRUM: Tear involving the posterior and posterior inferior labrum present with small paralabral cyst. There has been interval decreased sizes of the paralabral cysts.

BICEPS TENDON: The proximal intra-articular and extra-articular long head biceps tendon are intact.

GLENOHUMERAL JOINT: The glenohumeral articular cartilage is maintained. No focal chondral defect is identified. The glenohumeral joint is normal in alignment.

OTHER: The inferior glenohumeral ligament is unremarkable. No glenohumeral joint effusion.
IMPRESSION: 1.
No MRI evidence of a full-thickness rotator cuff tear.

2.
Tear involving the posterior and posterior inferior labrum with small paralabral cysts. There has been interval decreased sizes of the paralabral cysts.

## 7363-08-28 DEATH — deceased
